# Patient Record
Sex: Male | Born: 1980 | Race: White | Hispanic: No | Marital: Single | Smoking: Former smoker
Health system: Southern US, Community
[De-identification: ages and names within clinical notes are randomized; demographics above are authoritative.]

## PROBLEM LIST (undated history)

## (undated) HISTORY — PX: TONSILLECTOMY: SUR1361

---

## 2005-09-27 ENCOUNTER — Emergency Department: Payer: Self-pay | Admitting: Emergency Medicine

## 2013-12-23 ENCOUNTER — Ambulatory Visit: Payer: Self-pay | Admitting: Physician Assistant

## 2015-05-31 IMAGING — CR DG RIBS 2V*L*
1 series · 5 of 5 positions shown · non-contrast
Comparison: None.

CLINICAL DATA: Left-sided pain.

EXAM:
LEFT RIBS - 2 VIEW

[Series 1: oblique · 0.17mm/px · 5 of 5 slices shown]
[im 1/5]
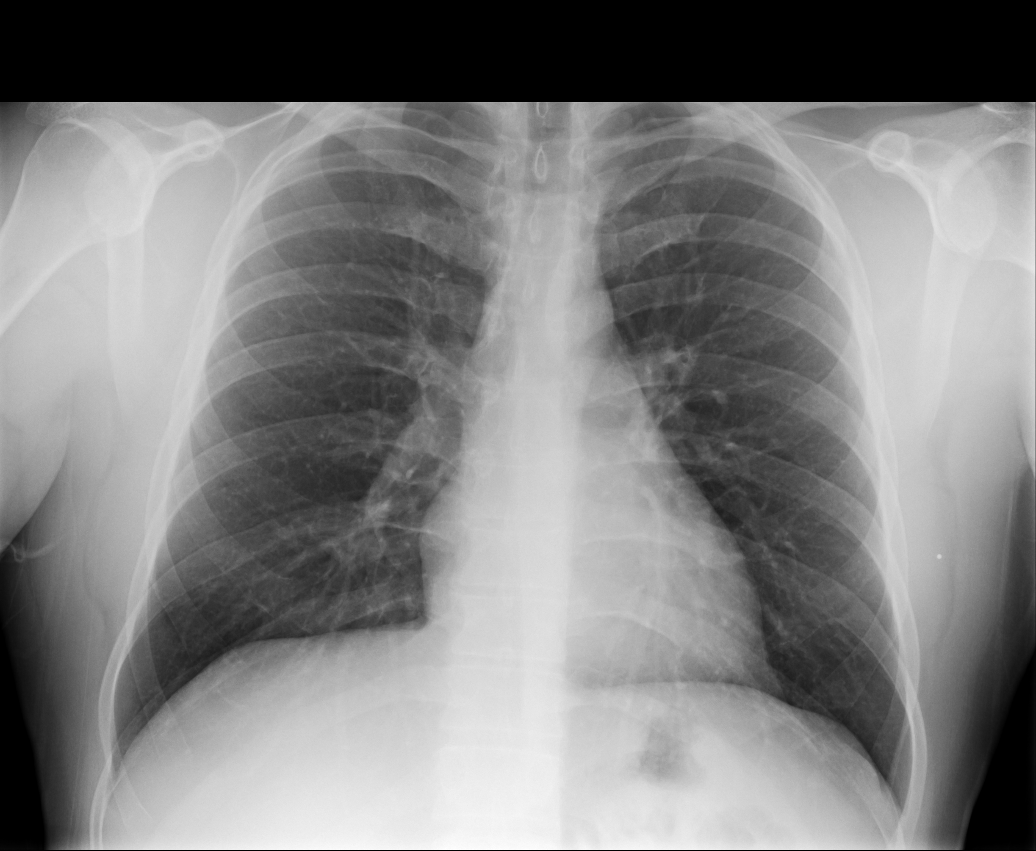
[im 2/5]
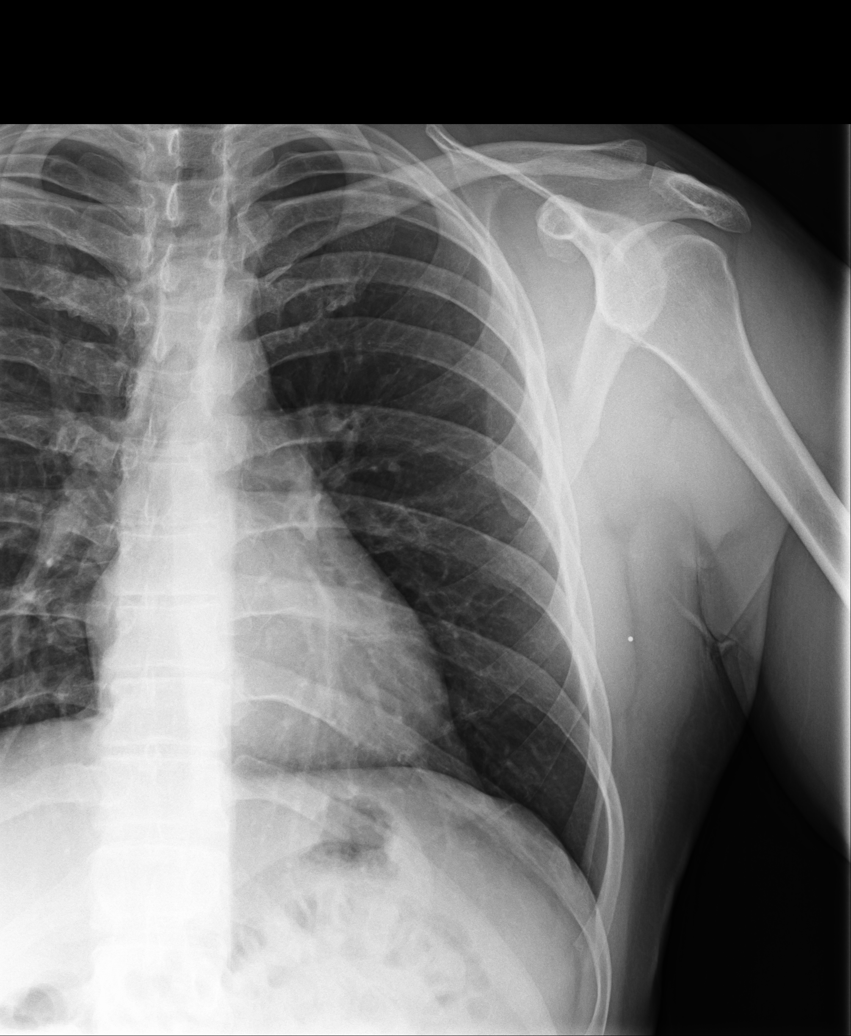
[im 3/5]
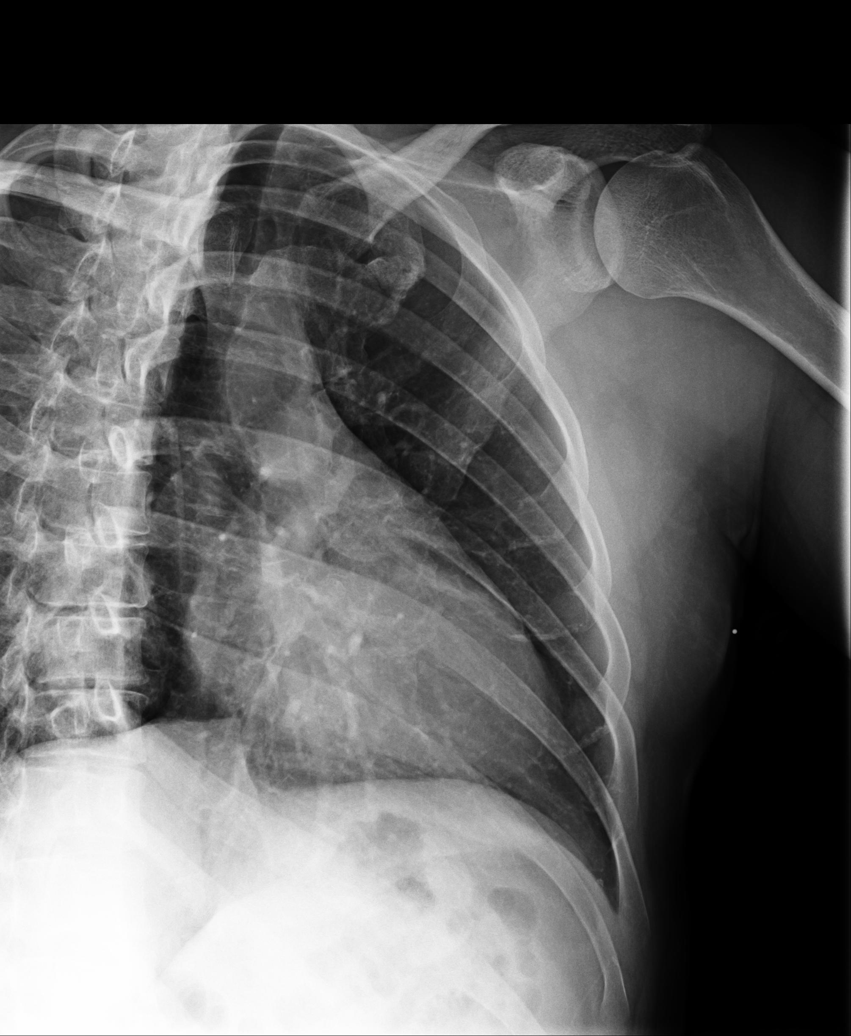
[im 4/5]
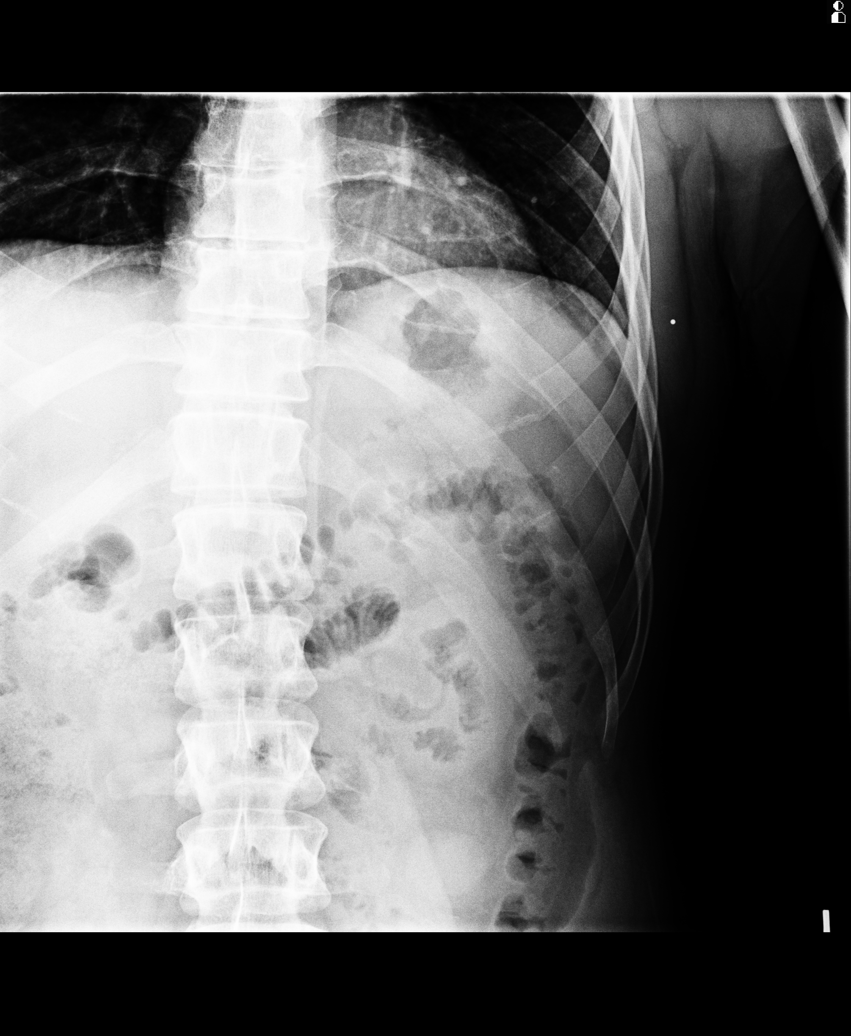
[im 5/5]
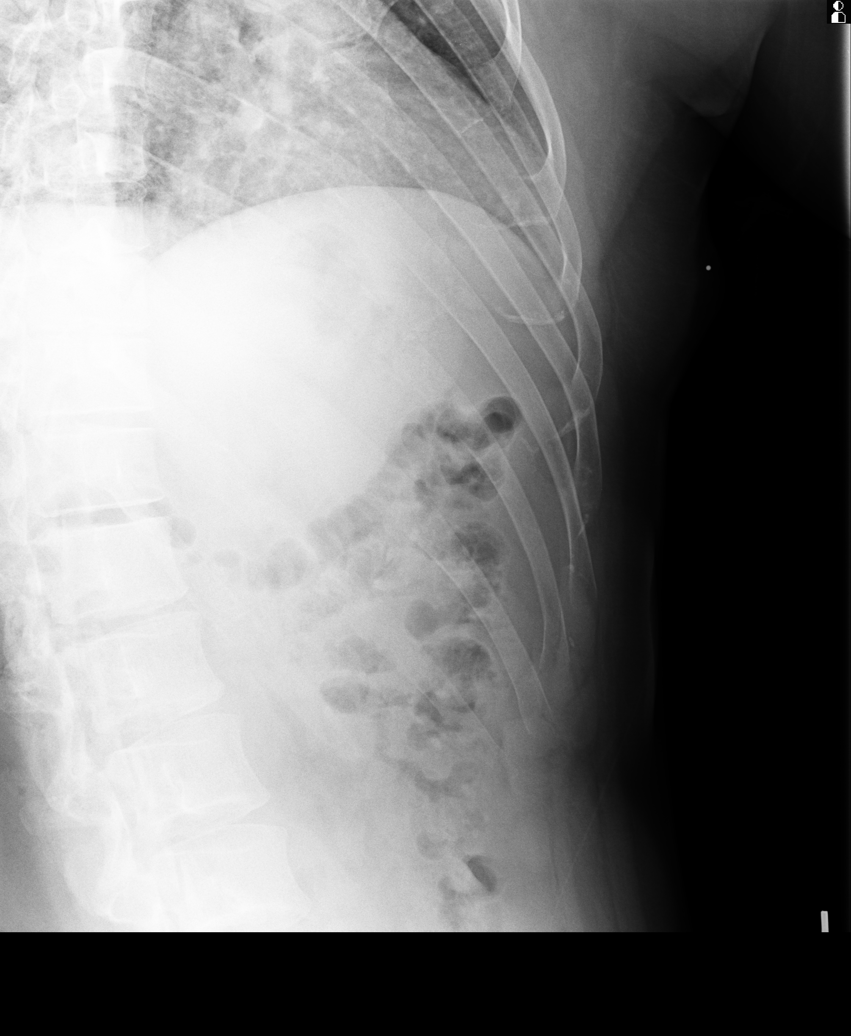

[5 of 5 positions shown; findings below may reference images not displayed]

FINDINGS: Lungs are adequately inflated and otherwise clear. Cardiomediastinal
silhouette is within normal. No acute left rib fracture.
IMPRESSION: Negative.

## 2018-11-15 ENCOUNTER — Other Ambulatory Visit: Payer: Self-pay

## 2018-11-15 ENCOUNTER — Ambulatory Visit
Admission: EM | Admit: 2018-11-15 | Discharge: 2018-11-15 | Disposition: A | Discharge status: Home / Self Care | Payer: Self-pay | Attending: Family Medicine | Admitting: Family Medicine

## 2018-11-15 DIAGNOSIS — J069 Acute upper respiratory infection, unspecified: Secondary | ICD-10-CM | POA: Insufficient documentation

## 2018-11-15 DIAGNOSIS — H6123 Impacted cerumen, bilateral: Secondary | ICD-10-CM | POA: Insufficient documentation

## 2018-11-15 DIAGNOSIS — B9789 Other viral agents as the cause of diseases classified elsewhere: Secondary | ICD-10-CM | POA: Insufficient documentation

## 2018-11-15 MED ORDER — BENZONATATE 100 MG PO CAPS: 100.0000 mg | ORAL_CAPSULE | Freq: Three times a day (TID) | ORAL | 0 refills | Status: AC | PRN

## 2018-11-15 MED ORDER — IPRATROPIUM BROMIDE 0.06 % NA SOLN: 2.0000 | Freq: Four times a day (QID) | NASAL | 0 refills | Status: AC | PRN

## 2018-11-15 MED ORDER — CETIRIZINE-PSEUDOEPHEDRINE ER 5-120 MG PO TB12: 1.0000 | ORAL_TABLET | Freq: Two times a day (BID) | ORAL | 0 refills | Status: AC

## 2018-11-15 NOTE — ED Provider Notes (Addendum)
[facility]    CSN: [id number] Arrival date & time: 11/15/18  0947  History   Chief Complaint Chief Complaint  Patient presents with  . Sinus Problem   HPI  38 year old male presents with upper respiratory symptoms.  Patient reports that he has been sick since past weekend.  Reports sore throat, postnasal drip, sinus pressure, headache, cough.  No fever.  No medications or interventions tried.  No known exacerbating relieving factors.  No reports of body aches.  No other associated symptoms.  No other complaints or concerns at this time.   History reviewed as below. Past Surgical History:  Procedure Laterality Date  . TONSILLECTOMY     Home Medications    Prior to Admission medications   Medication Sig Start Date End Date Taking? Authorizing Provider  benzonatate (TESSALON) 100 MG capsule Take 1 capsule (100 mg total) by mouth 3 (three) times daily as needed. 11/15/18   Tommie Sams, DO  cetirizine-pseudoephedrine (ZYRTEC-D) 5-120 MG tablet Take 1 tablet by mouth 2 (two) times daily. 11/15/18   Tommie Sams, DO  ipratropium (ATROVENT) 0.06 % nasal spray Place 2 sprays into both nostrils 4 (four) times daily as needed for rhinitis. 11/15/18   Tommie Sams, DO   Social History Social History   Tobacco Use  . Smoking status: Former Games developer  . Smokeless tobacco: Never Used  Substance Use Topics  . Alcohol use: Yes    Comment: rare  . Drug use: Never     Allergies   Patient has no known allergies.   Review of Systems Review of Systems  Constitutional: Negative for fever.  HENT: Positive for postnasal drip, sinus pressure and sore throat.   Respiratory: Positive for cough.    Physical Exam Triage Vital Signs ED Triage Vitals  Enc Vitals Group     BP 11/15/18 0955 (!) 154/90     Pulse Rate 11/15/18 0955 (!) 54     Resp 11/15/18 0955 16     Temp 11/15/18 0955 97.7 F (36.5 C)     Temp Source 11/15/18 0955 Oral     SpO2 11/15/18 0955 99 %     Weight  11/15/18 0956 190 lb (86.2 kg)     Height 11/15/18 0956 6' (1.829 m)     Head Circumference --      Peak Flow --      Pain Score 11/15/18 0956 5     Pain Loc --      Pain Edu? --      Excl. in GC? --    Updated Vital Signs BP (!) 154/90 (BP Location: Left Arm)   Pulse (!) 54   Temp 97.7 F (36.5 C) (Oral)   Resp 16   Ht 6' (1.829 m)   Wt 86.2 kg   SpO2 99%   BMI 25.77 kg/m   Visual Acuity Right Eye Distance:   Left Eye Distance:   Bilateral Distance:    Right Eye Near:   Left Eye Near:    Bilateral Near:     Physical Exam Vitals signs and nursing note reviewed.  Constitutional:      General: He is not in acute distress. HENT:     Head: Normocephalic and atraumatic.     Ears:     Comments: TMs obscured by cerumen bilaterally.    Nose: Nose normal. No congestion or rhinorrhea.     Mouth/Throat:     Comments: Cobblestoning noted. Eyes:     General:  Right eye: No discharge.        Left eye: No discharge.     Conjunctiva/sclera: Conjunctivae normal.  Cardiovascular:     Rate and Rhythm: Regular rhythm. Bradycardia present.  Pulmonary:     Effort: Pulmonary effort is normal.     Breath sounds: No wheezing, rhonchi or rales.  Neurological:     Mental Status: He is alert.  Psychiatric:        Mood and Affect: Mood normal.        Behavior: Behavior normal.    UC Treatments / Results  Labs (all labs ordered are listed, but only abnormal results are displayed) Labs Reviewed - No data to display  EKG None  Radiology No results found.  Procedures Procedures (including critical care time)  Medications Ordered in UC Medications - No data to display  Initial Impression / Assessment and Plan / UC Course  I have reviewed the triage vital signs and the nursing notes.  Pertinent labs & imaging results that were available during my care of the patient were reviewed by me and considered in my medical decision making (see chart for details).      38 year old male presents with a viral URI with cough.  Treating with Atrovent nasal spray, Zyrtec-D, Tessalon Perles.  Cerumen impaction resolved today after irrigation.  Final Clinical Impressions(s) / UC Diagnoses   Final diagnoses:  Viral URI with cough  Bilateral impacted cerumen   Discharge Instructions   None    ED Prescriptions    Medication Sig Dispense Auth. Provider   ipratropium (ATROVENT) 0.06 % nasal spray Place 2 sprays into both nostrils 4 (four) times daily as needed for rhinitis. 15 mL Brittanyann Wittner G, DO   cetirizine-pseudoephedrine (ZYRTEC-D) 5-120 MG tablet Take 1 tablet by mouth 2 (two) times daily. 30 tablet Guiliana Shor G, DO   benzonatate (TESSALON) 100 MG capsule Take 1 capsule (100 mg total) by mouth 3 (three) times daily as needed. 30 capsule Tommie Sams, DO     Controlled Substance Prescriptions [place] Controlled Substance Registry consulted? Not Applicable   Tommie Sams, DO 11/15/18 1057    [address] [place], [place] 11/15/18 1058

## 2018-11-15 NOTE — ED Triage Notes (Signed)
Pt with headache, facial pressure, ear pressure, coughing up green yellow phlegm, post nasal drip with scratchy throat.
